# Patient Record
Sex: Male | Born: 1985 | Race: Black or African American | Hispanic: No | Marital: Single | Smoking: Never smoker
Health system: Southern US, Community
[De-identification: ages and names within clinical notes are randomized; demographics above are authoritative.]

---

## 2008-08-19 ENCOUNTER — Emergency Department (HOSPITAL_COMMUNITY): Admission: EM | Admit: 2008-08-19 | Discharge: 2008-08-19 | Payer: Self-pay | Admitting: Emergency Medicine

## 2008-11-01 ENCOUNTER — Emergency Department (HOSPITAL_COMMUNITY): Admission: EM | Admit: 2008-11-01 | Discharge: 2008-11-01 | Payer: Self-pay | Admitting: Emergency Medicine

## 2011-01-25 LAB — POCT I-STAT, CHEM 8
Calcium, Ion: 1.2 mmol/L (ref 1.12–1.32)
Glucose, Bld: 73 mg/dL (ref 70–99)
HCT: 45 % (ref 39.0–52.0)
Hemoglobin: 15.3 g/dL (ref 13.0–17.0)
TCO2: 32 mmol/L (ref 0–100)

## 2011-01-25 LAB — CBC
Hemoglobin: 13.3 g/dL (ref 13.0–17.0)
MCHC: 32.2 g/dL (ref 30.0–36.0)
MCV: 89.8 fL (ref 78.0–100.0)
RBC: 4.6 MIL/uL (ref 4.22–5.81)
RDW: 12.2 % (ref 11.5–15.5)

## 2011-01-25 LAB — DIFFERENTIAL
Basophils Absolute: 0 10*3/uL (ref 0.0–0.1)
Basophils Relative: 1 % (ref 0–1)
Eosinophils Absolute: 0.2 10*3/uL (ref 0.0–0.7)
Monocytes Absolute: 0.5 10*3/uL (ref 0.1–1.0)
Monocytes Relative: 9 % (ref 3–12)

## 2016-02-13 ENCOUNTER — Ambulatory Visit (HOSPITAL_COMMUNITY)
Admission: EM | Admit: 2016-02-13 | Discharge: 2016-02-13 | Disposition: A | Discharge status: Home / Self Care | Payer: No Typology Code available for payment source | Attending: Emergency Medicine | Admitting: Emergency Medicine

## 2016-02-13 ENCOUNTER — Encounter (HOSPITAL_COMMUNITY): Payer: Self-pay | Admitting: *Deleted

## 2016-02-13 ENCOUNTER — Ambulatory Visit (HOSPITAL_COMMUNITY): Payer: No Typology Code available for payment source

## 2016-02-13 ENCOUNTER — Ambulatory Visit (INDEPENDENT_AMBULATORY_CARE_PROVIDER_SITE_OTHER): Payer: No Typology Code available for payment source

## 2016-02-13 ENCOUNTER — Ambulatory Visit (HOSPITAL_COMMUNITY): Admission: EM | Admit: 2016-02-13 | Discharge: 2016-02-13 | Discharge status: Absent without leave | Payer: Self-pay

## 2016-02-13 DIAGNOSIS — S134XXA Sprain of ligaments of cervical spine, initial encounter: Secondary | ICD-10-CM | POA: Diagnosis not present

## 2016-02-13 DIAGNOSIS — M542 Cervicalgia: Secondary | ICD-10-CM | POA: Diagnosis not present

## 2016-02-13 DIAGNOSIS — R519 Headache, unspecified: Secondary | ICD-10-CM

## 2016-02-13 DIAGNOSIS — R51 Headache: Secondary | ICD-10-CM

## 2016-02-13 MED ORDER — DICLOFENAC SODIUM 75 MG PO TBEC: 75.0000 mg | DELAYED_RELEASE_TABLET | Freq: Two times a day (BID) | ORAL | Status: AC

## 2016-02-13 MED ORDER — CYCLOBENZAPRINE HCL 10 MG PO TABS: 10.0000 mg | ORAL_TABLET | Freq: Three times a day (TID) | ORAL | Status: AC | PRN
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
## 2016-02-13 NOTE — ED Notes (Signed)
Went to get patient for cervical spine exam, patient on his phone, not in a gown.
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
## 2016-05-18 ENCOUNTER — Ambulatory Visit (HOSPITAL_COMMUNITY)
Admission: EM | Admit: 2016-05-18 | Discharge: 2016-05-18 | Disposition: A | Discharge status: Home / Self Care | Payer: Managed Care, Other (non HMO) | Attending: Family Medicine | Admitting: Family Medicine

## 2016-05-18 ENCOUNTER — Encounter (HOSPITAL_COMMUNITY): Payer: Self-pay | Admitting: *Deleted

## 2016-05-18 DIAGNOSIS — R21 Rash and other nonspecific skin eruption: Secondary | ICD-10-CM | POA: Insufficient documentation

## 2016-05-18 DIAGNOSIS — Z202 Contact with and (suspected) exposure to infections with a predominantly sexual mode of transmission: Secondary | ICD-10-CM

## 2016-05-18 DIAGNOSIS — K648 Other hemorrhoids: Secondary | ICD-10-CM

## 2016-05-18 DIAGNOSIS — L03311 Cellulitis of abdominal wall: Secondary | ICD-10-CM

## 2016-05-18 MED ORDER — PENICILLIN G BENZATHINE & PROC 900000-300000 UNIT/2ML IM SUSP: 2.4000 10*6.[IU] | Freq: Once | INTRAMUSCULAR | Status: DC

## 2016-05-18 NOTE — ED Provider Notes (Signed)
[facility]    CSN: [id number] Arrival date & time: 05/18/16  1027  First Provider Contact:  First MD Initiated Contact with Patient 05/18/16 1057        History   Chief Complaint Chief Complaint  Patient presents with  . Rash    HPI Gerald Valenzuela is a 30 y.o. male.   The history is provided by the patient.  Rash  Location:  Pelvis Pelvic rash location:  Penis Quality: not blistering, not draining, not itchy, not painful, not peeling, not red, not scaling, not swelling and not weeping   Severity:  Mild Onset quality:  Sudden Duration:  4 days Chronicity:  New Context comment:  Not sexually active Relieved by:  None tried Ineffective treatments:  None tried Associated symptoms: no diarrhea, no fever, no nausea and not vomiting   Associated symptoms comment:  Hemorrhoid swelling,off and on.   History reviewed. No pertinent past medical history.  There are no active problems to display for this patient.   History reviewed. No pertinent surgical history.     Home Medications    Prior to Admission medications   Medication Sig Start Date End Date Taking? Authorizing Provider  cyclobenzaprine (FLEXERIL) 10 MG tablet Take 1 tablet (10 mg total) by mouth 3 (three) times daily as needed for muscle spasms. 02/13/16   Riki Sheer, PA-C  diclofenac (VOLTAREN) 75 MG EC tablet Take 1 tablet (75 mg total) by mouth 2 (two) times daily. 02/13/16   Riki Sheer, PA-C    Family History History reviewed. No pertinent family history.  Social History Social History  Substance Use Topics  . Smoking status: Never Smoker  . Smokeless tobacco: Never Used  . Alcohol use Yes     Allergies   Review of patient's allergies indicates no known allergies.   Review of Systems Review of Systems  Constitutional: Negative.  Negative for fever.  Respiratory: Negative.   Gastrointestinal: Positive for anal bleeding. Negative for constipation, diarrhea, nausea,  rectal pain and vomiting.  Genitourinary: Negative for discharge, genital sores, penile pain, penile swelling, scrotal swelling and testicular pain.  Skin: Positive for rash.  All other systems reviewed and are negative.    Physical Exam Triage Vital Signs ED Triage Vitals  Enc Vitals Group     BP      Pulse      Resp      Temp      Temp src      SpO2      Weight      Height      Head Circumference      Peak Flow      Pain Score      Pain Loc      Pain Edu?      Excl. in GC?    No data found.   Updated Vital Signs There were no vitals taken for this visit.  Visual Acuity Right Eye Distance:   Left Eye Distance:   Bilateral Distance:    Right Eye Near:   Left Eye Near:    Bilateral Near:     Physical Exam  Constitutional: He appears well-developed and well-nourished.  Abdominal: Soft. Bowel sounds are normal. He exhibits no distension and no mass. There is no tenderness. There is no rebound and no guarding. No hernia.  Genitourinary: Rectum normal.  Genitourinary Comments: No hemorrhoid evident or tears.  Nursing note and vitals reviewed.    UC Treatments / Results  Labs (  all labs ordered are listed, but only abnormal results are displayed) Labs Reviewed - No data to display  EKG  EKG Interpretation None       Radiology No results found.  Procedures Procedures (including critical care time)  Medications Ordered in UC Medications - No data to display   Initial Impression / Assessment and Plan / UC Course  I have reviewed the triage vital signs and the nursing notes.  Pertinent labs & imaging results that were available during my care of the patient were reviewed by me and considered in my medical decision making (see chart for details).  Clinical Course      Final Clinical Impressions(s) / UC Diagnoses   Final diagnoses:  None    New Prescriptions New Prescriptions   No medications on file     Linna HoffJames D Jachob Mcclean, MD 05/18/16  1116

## 2016-05-18 NOTE — ED Triage Notes (Signed)
Pt  Reports   Symptoms of  A    Bump  On  His  Penis       For  About   1    Week     Blood  On   His  Tissue    When  He   Wipes       He  Also  Reports   An itching   Flaking  Scalp   He  Is  In no  Acute/  Severe  distress

## 2016-05-18 NOTE — Discharge Instructions (Addendum)
See gastroent, if further problems

## 2016-05-18 NOTE — ED Notes (Signed)
Pt  Reports    He  Wants  To  Be  Checked  For  Std   As  well

## 2016-05-19 LAB — URINE CYTOLOGY ANCILLARY ONLY
CHLAMYDIA, DNA PROBE: NEGATIVE
Neisseria Gonorrhea: NEGATIVE
Trichomonas: NEGATIVE

## 2016-05-19 LAB — RPR: RPR Ser Ql: NONREACTIVE

## 2016-05-19 LAB — HIV ANTIBODY (ROUTINE TESTING W REFLEX): HIV Screen 4th Generation wRfx: NONREACTIVE
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
## 2020-12-16 ENCOUNTER — Telehealth: Payer: Managed Care, Other (non HMO) | Admitting: Physician Assistant

## 2020-12-16 DIAGNOSIS — R200 Anesthesia of skin: Secondary | ICD-10-CM

## 2020-12-16 NOTE — Progress Notes (Signed)
I have spent 5 minutes in review of e-visit questionnaire, review and updating patient chart, medical decision making and response to patient.   Serria Sloma Cody Shaunte Tuft, PA-C    

## 2020-12-16 NOTE — Progress Notes (Signed)
Based on what you shared with me, I feel your condition warrants further evaluation and I recommend that you be seen for a face to face office visit. Giving the mention of tingling and numbness under the eye and now the lip, I do think you need to be seen in person for examination. There are several things that could cause this and we need to see if there are two separate issues here or just one problem causing all symptoms. Do not use any more blistex. If you have not already, take an antihistamine like Claritin or Benadryl to help with any itching/tingling of lips in case this is related to allergic reaction.    NOTE: If you entered your credit card information for this eVisit, you will not be charged. You may see a "hold" on your card for the $35 but that hold will drop off and you will not have a charge processed.   If you are having a true medical emergency please call 911.      For an urgent face to face visit, [place] has five urgent care centers for your convenience:     [facility] at [facility] Directions [phone] [address] [place], [place] [address] . 10 am - 6pm Monday - Friday    [facility]) Get Driving Directions [phone] [address] [place], [place] [address] . 10 am to 8 pm Monday-Friday . 12 pm to 8 pm [facility] Urgent Care at [facility] Get Driving Directions [phone] [address] [place], [place] [address] . 8 am to 8 pm Monday-Friday . 9 am to 6 pm Saturday . 11 am to 6 pm Sunday     [facility] at [facility] Get Driving Directions  [phone] [address] [place], [place] [address] . 8 am to 8 pm Monday-Friday . 8 am to 4 pm [facility] Urgent Care at [facility] Directions [phone] [address] [place], [place] [address] . 12 pm to 6  pm Monday-Friday      Your e-visit answers were reviewed by a board certified advanced clinical practitioner to complete your personal care plan.  Thank you for using e-Visits.

## 2020-12-16 NOTE — Progress Notes (Signed)
Message sent to patient requesting further input regarding current symptoms. Awaiting patient response.  Also patient asked to submit separate visit for eye symptoms.

## 2021-01-28 ENCOUNTER — Ambulatory Visit: Payer: Managed Care, Other (non HMO) | Admitting: Nurse Practitioner
